# Patient Record
Sex: Male | Born: 1960 | Race: White | Hispanic: No | Marital: Married | Smoking: Former smoker
Health system: Southern US, Community
[De-identification: ages and names within clinical notes are randomized; demographics above are authoritative.]

## PROBLEM LIST (undated history)

## (undated) DIAGNOSIS — S065XAA Traumatic subdural hemorrhage with loss of consciousness status unknown, initial encounter: Secondary | ICD-10-CM

## (undated) DIAGNOSIS — R001 Bradycardia, unspecified: Secondary | ICD-10-CM

## (undated) DIAGNOSIS — S065X9A Traumatic subdural hemorrhage with loss of consciousness of unspecified duration, initial encounter: Secondary | ICD-10-CM

## (undated) DIAGNOSIS — E785 Hyperlipidemia, unspecified: Secondary | ICD-10-CM

---

## 2015-12-04 HISTORY — PX: BRAIN SURGERY: SHX531

## 2016-01-04 HISTORY — PX: PACEMAKER INSERTION: SHX728

## 2016-04-02 HISTORY — PX: CAROTID STENT: SHX1301

## 2016-04-27 ENCOUNTER — Ambulatory Visit (INDEPENDENT_AMBULATORY_CARE_PROVIDER_SITE_OTHER)
Admission: EM | Admit: 2016-04-27 | Discharge: 2016-04-27 | Disposition: A | Discharge status: Other Acute Inpatient Hospital | Payer: Managed Care, Other (non HMO) | Source: Home / Self Care | Attending: Internal Medicine | Admitting: Internal Medicine

## 2016-04-27 ENCOUNTER — Emergency Department: Payer: Managed Care, Other (non HMO)

## 2016-04-27 ENCOUNTER — Observation Stay
Admission: EM | Admit: 2016-04-27 | Discharge: 2016-04-28 | Disposition: A | Discharge status: Home / Self Care | Payer: Managed Care, Other (non HMO) | Attending: Internal Medicine | Admitting: Internal Medicine

## 2016-04-27 DIAGNOSIS — R51 Headache: Secondary | ICD-10-CM | POA: Insufficient documentation

## 2016-04-27 DIAGNOSIS — R001 Bradycardia, unspecified: Secondary | ICD-10-CM | POA: Diagnosis not present

## 2016-04-27 DIAGNOSIS — Z950 Presence of cardiac pacemaker: Secondary | ICD-10-CM | POA: Insufficient documentation

## 2016-04-27 DIAGNOSIS — G4700 Insomnia, unspecified: Secondary | ICD-10-CM | POA: Insufficient documentation

## 2016-04-27 DIAGNOSIS — E785 Hyperlipidemia, unspecified: Secondary | ICD-10-CM | POA: Insufficient documentation

## 2016-04-27 DIAGNOSIS — I081 Rheumatic disorders of both mitral and tricuspid valves: Secondary | ICD-10-CM | POA: Insufficient documentation

## 2016-04-27 DIAGNOSIS — M50322 Other cervical disc degeneration at C5-C6 level: Secondary | ICD-10-CM | POA: Diagnosis not present

## 2016-04-27 DIAGNOSIS — M50321 Other cervical disc degeneration at C4-C5 level: Secondary | ICD-10-CM | POA: Insufficient documentation

## 2016-04-27 DIAGNOSIS — R519 Headache, unspecified: Secondary | ICD-10-CM | POA: Diagnosis present

## 2016-04-27 DIAGNOSIS — R202 Paresthesia of skin: Secondary | ICD-10-CM

## 2016-04-27 DIAGNOSIS — R531 Weakness: Secondary | ICD-10-CM | POA: Diagnosis not present

## 2016-04-27 DIAGNOSIS — M50323 Other cervical disc degeneration at C6-C7 level: Secondary | ICD-10-CM | POA: Insufficient documentation

## 2016-04-27 DIAGNOSIS — Z955 Presence of coronary angioplasty implant and graft: Secondary | ICD-10-CM | POA: Insufficient documentation

## 2016-04-27 DIAGNOSIS — G459 Transient cerebral ischemic attack, unspecified: Secondary | ICD-10-CM | POA: Diagnosis not present

## 2016-04-27 DIAGNOSIS — F17210 Nicotine dependence, cigarettes, uncomplicated: Secondary | ICD-10-CM | POA: Insufficient documentation

## 2016-04-27 HISTORY — DX: Traumatic subdural hemorrhage with loss of consciousness of unspecified duration, initial encounter: S06.5X9A

## 2016-04-27 HISTORY — DX: Bradycardia, unspecified: R00.1

## 2016-04-27 HISTORY — DX: Traumatic subdural hemorrhage with loss of consciousness status unknown, initial encounter: S06.5XAA

## 2016-04-27 HISTORY — DX: Hyperlipidemia, unspecified: E78.5

## 2016-04-27 LAB — CBC
HEMATOCRIT: 37.9 % — AB (ref 40.0–52.0)
HEMOGLOBIN: 13 g/dL (ref 13.0–18.0)
MCH: 32 pg (ref 26.0–34.0)
MCHC: 34.2 g/dL (ref 32.0–36.0)
MCV: 93.5 fL (ref 80.0–100.0)
Platelets: 261 10*3/uL (ref 150–440)
RBC: 4.06 MIL/uL — AB (ref 4.40–5.90)
RDW: 13.3 % (ref 11.5–14.5)
WBC: 9.5 10*3/uL (ref 3.8–10.6)

## 2016-04-27 LAB — DIFFERENTIAL
Basophils Absolute: 0.1 10*3/uL (ref 0–0.1)
Basophils Relative: 1 %
Eosinophils Absolute: 0.2 10*3/uL (ref 0–0.7)
Eosinophils Relative: 2 %
LYMPHS ABS: 3.4 10*3/uL (ref 1.0–3.6)
LYMPHS PCT: 36 %
MONO ABS: 0.8 10*3/uL (ref 0.2–1.0)
Monocytes Relative: 9 %
Neutro Abs: 5 10*3/uL (ref 1.4–6.5)
Neutrophils Relative %: 52 %

## 2016-04-27 LAB — COMPREHENSIVE METABOLIC PANEL
ALK PHOS: 48 U/L (ref 38–126)
ALT: 10 U/L — ABNORMAL LOW (ref 17–63)
ANION GAP: 6 (ref 5–15)
AST: 20 U/L (ref 15–41)
Albumin: 4.4 g/dL (ref 3.5–5.0)
BILIRUBIN TOTAL: 0.3 mg/dL (ref 0.3–1.2)
BUN: 11 mg/dL (ref 6–20)
CALCIUM: 9.3 mg/dL (ref 8.9–10.3)
CO2: 28 mmol/L (ref 22–32)
CREATININE: 1 mg/dL (ref 0.61–1.24)
Chloride: 109 mmol/L (ref 101–111)
GFR calc non Af Amer: >60 mL/min (ref >60)
GLUCOSE: 103 mg/dL — AB (ref 65–99)
Potassium: 3.7 mmol/L (ref 3.5–5.1)
Sodium: 143 mmol/L (ref 135–145)
TOTAL PROTEIN: 7.3 g/dL (ref 6.5–8.1)

## 2016-04-27 LAB — APTT: aPTT: 29 seconds (ref 24–36)

## 2016-04-27 LAB — TROPONIN I: Troponin I: <0.03 ng/mL (ref <0.031)

## 2016-04-27 LAB — PROTIME-INR
INR: 1.11
Prothrombin Time: 14.5 seconds (ref 11.4–15.0)

## 2016-04-27 MED ORDER — ONDANSETRON HCL 4 MG/2ML IJ SOLN
4.0000 mg | Freq: Once | INTRAMUSCULAR | Status: AC
  Administered 2016-04-27: 4 mg via INTRAVENOUS
  Filled 2016-04-27: qty 2

## 2016-04-27 MED ORDER — MORPHINE SULFATE (PF) 4 MG/ML IV SOLN
INTRAVENOUS | Status: AC
  Filled 2016-04-27: qty 1

## 2016-04-27 MED ORDER — MORPHINE SULFATE (PF) 4 MG/ML IV SOLN
4.0000 mg | Freq: Once | INTRAVENOUS | Status: AC
  Administered 2016-04-27: 4 mg via INTRAVENOUS
  Filled 2016-04-27: qty 1

## 2016-04-27 MED ORDER — KETOROLAC TROMETHAMINE 60 MG/2ML IM SOLN
60.0000 mg | Freq: Once | INTRAMUSCULAR | Status: AC
  Administered 2016-04-27: 60 mg via INTRAMUSCULAR

## 2016-04-27 MED ORDER — MORPHINE SULFATE (PF) 4 MG/ML IV SOLN
4.0000 mg | Freq: Once | INTRAVENOUS | Status: AC
  Administered 2016-04-27: 4 mg via INTRAVENOUS
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
## 2016-04-27 NOTE — Consult Note (Signed)
Stroke Alert-patient is calm, following up on referral from urgent care and has experienced much pain from headaches. Given past history he is careful with this type of symptom and is willing to undergo additional procedures in order to be sure he is able to work, relieve his headaches.  Married, but wife is not present; he states he is emotionally okay, no need for assistance at this time.
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
## 2016-04-28 ENCOUNTER — Emergency Department: Payer: Managed Care, Other (non HMO)

## 2016-04-28 ENCOUNTER — Observation Stay
Admit: 2016-04-28 | Discharge: 2016-04-28 | Disposition: A | Discharge status: Home / Self Care | Payer: Managed Care, Other (non HMO) | Attending: Internal Medicine | Admitting: Internal Medicine

## 2016-04-28 DIAGNOSIS — R51 Headache: Secondary | ICD-10-CM

## 2016-04-28 DIAGNOSIS — R519 Headache, unspecified: Secondary | ICD-10-CM | POA: Diagnosis present

## 2016-04-28 DIAGNOSIS — G451 Carotid artery syndrome (hemispheric): Secondary | ICD-10-CM

## 2016-04-28 DIAGNOSIS — G459 Transient cerebral ischemic attack, unspecified: Secondary | ICD-10-CM | POA: Diagnosis present

## 2016-04-28 LAB — LIPID PANEL
CHOL/HDL RATIO: 2.1 ratio
CHOLESTEROL: 112 mg/dL (ref 0–200)
HDL: 54 mg/dL (ref >40)
LDL Cholesterol: 33 mg/dL (ref 0–99)
TRIGLYCERIDES: 124 mg/dL (ref <150)
VLDL: 25 mg/dL (ref 0–40)

## 2016-04-28 LAB — ECHOCARDIOGRAM COMPLETE
HEIGHTINCHES: 74 in
Weight: 2935 oz

## 2016-04-28 LAB — HEMOGLOBIN A1C: HEMOGLOBIN A1C: 5.4 % (ref 4.0–6.0)

## 2016-04-28 MED ORDER — MORPHINE SULFATE (PF) 4 MG/ML IV SOLN
4.0000 mg | Freq: Once | INTRAVENOUS | Status: AC
Start: 2016-04-28 — End: 2016-04-28
  Administered 2016-04-28: 4 mg via INTRAVENOUS

## 2016-04-28 MED ORDER — OXYCODONE-ACETAMINOPHEN 5-325 MG PO TABS
ORAL_TABLET | ORAL | Status: AC
  Filled 2016-04-28: qty 1

## 2016-04-28 MED ORDER — MORPHINE SULFATE (PF) 2 MG/ML IV SOLN
2.0000 mg | INTRAVENOUS | Status: DC | PRN
  Administered 2016-04-28 (×2): 2 mg via INTRAVENOUS
  Filled 2016-04-28 (×2): qty 1

## 2016-04-28 MED ORDER — STROKE: EARLY STAGES OF RECOVERY BOOK: Freq: Once | Status: DC

## 2016-04-28 MED ORDER — ASPIRIN 300 MG RE SUPP
300.0000 mg | Freq: Every day | RECTAL | Status: DC
  Filled 2016-04-28: qty 1

## 2016-04-28 MED ORDER — MORPHINE SULFATE (PF) 4 MG/ML IV SOLN
INTRAVENOUS | Status: AC
  Filled 2016-04-28: qty 1

## 2016-04-28 MED ORDER — ENOXAPARIN SODIUM 40 MG/0.4ML ~~LOC~~ SOLN: 40.0000 mg | SUBCUTANEOUS | Status: DC

## 2016-04-28 MED ORDER — IOPAMIDOL (ISOVUE-370) INJECTION 76%
100.0000 mL | Freq: Once | INTRAVENOUS | Status: AC | PRN
  Administered 2016-04-28: 100 mL via INTRAVENOUS

## 2016-04-28 MED ORDER — OXYCODONE-ACETAMINOPHEN 7.5-325 MG PO TABS
1.0000 | ORAL_TABLET | Freq: Four times a day (QID) | ORAL | Status: DC | PRN
  Administered 2016-04-28 (×2): 1 via ORAL
  Filled 2016-04-28 (×2): qty 1

## 2016-04-28 MED ORDER — ROSUVASTATIN CALCIUM 20 MG PO TABS
20.0000 mg | ORAL_TABLET | Freq: Every day | ORAL | Status: DC
  Administered 2016-04-28: 09:00:00 20 mg via ORAL
  Filled 2016-04-28: qty 1

## 2016-04-28 MED ORDER — CLOPIDOGREL BISULFATE 75 MG PO TABS
75.0000 mg | ORAL_TABLET | Freq: Every day | ORAL | Status: DC
  Administered 2016-04-28: 09:00:00 75 mg via ORAL
  Filled 2016-04-28: qty 1

## 2016-04-28 MED ORDER — ASPIRIN 325 MG PO TABS
325.0000 mg | ORAL_TABLET | Freq: Every day | ORAL | Status: DC
  Administered 2016-04-28: 325 mg via ORAL

## 2016-04-28 MED ORDER — ADULT MULTIVITAMIN W/MINERALS CH
1.0000 | ORAL_TABLET | Freq: Every day | ORAL | Status: DC
  Administered 2016-04-28: 1 via ORAL
  Filled 2016-04-28: qty 1

## 2016-04-28 MED ORDER — ASPIRIN 325 MG PO TABS
325.0000 mg | ORAL_TABLET | Freq: Every day | ORAL | Status: DC
  Filled 2016-04-28: qty 1

## 2016-04-28 NOTE — H&P (Signed)
[facility] - [place] [facility]   PATIENT NAME: Tanner Rangel    MR#:  [id number]  DATE OF BIRTH:  1961-08-23  DATE OF ADMISSION:  04/27/2016  PRIMARY CARE PHYSICIAN: No primary care provider on file.   REQUESTING/REFERRING PHYSICIAN:   CHIEF COMPLAINT:   Chief Complaint  Patient presents with  . Headache    HISTORY OF PRESENT ILLNESS: Tanner Rangel  is a 55 y.o. male with a known history of Hyperlipidemia, subdural hematoma in the past presented to the emergency room with left arm numbness and weakness started yesterday. Patient also had a headache since yesterday which was pounding in nature. No history of any nausea or vomiting. No history of any slurred speech or dysarthria. Telemetry neurology consult was done in the emergency room was recommended observation admission and further workup. No history of any chest pain and shortness of breath. No history of any fever or chills or cough. Patient has a pacemaker.  PAST MEDICAL HISTORY:   Past Medical History  Diagnosis Date  . Bradycardia   . Hyperlipidemia   . Subdural hematoma (HCC)     PAST SURGICAL HISTORY: Past Surgical History  Procedure Laterality Date  . Carotid stent Left 04/2016  . Pacemaker insertion  01/2016  . Brain surgery  2017    SOCIAL HISTORY:  Social History  Substance Use Topics  . Smoking status: Current Every Day Smoker -- 0.10 packs/day for 45 years    Types: Cigarettes  . Smokeless tobacco: Not on file  . Alcohol Use: 0.0 oz/week    0 Standard drinks or equivalent per week     Comment: occasionally    FAMILY HISTORY:  Family History  Problem Relation Age of Onset  . Heart disease Father     DRUG ALLERGIES: No Known Allergies  REVIEW OF SYSTEMS:   CONSTITUTIONAL: No fever, fatigue or weakness.  EYES: No blurred or double vision.  EARS, NOSE, AND THROAT: No tinnitus or ear pain.  RESPIRATORY: No cough, shortness of breath, wheezing or hemoptysis.   CARDIOVASCULAR: No chest pain, orthopnea, edema.  GASTROINTESTINAL: No nausea, vomiting, diarrhea or abdominal pain.  GENITOURINARY: No dysuria, hematuria.  ENDOCRINE: No polyuria, nocturia,  HEMATOLOGY: No anemia, easy bruising or bleeding SKIN: No rash or lesion. MUSCULOSKELETAL: No joint pain or arthritis.left arm numbness   NEUROLOGIC: tingling, numbness, weakness left arm. PSYCHIATRY: No anxiety or depression.   MEDICATIONS AT HOME:  Prior to Admission medications   Medication Sig Start Date End Date Taking? Authorizing Provider  aspirin 325 MG tablet Take 325 mg by mouth daily.   Yes Historical Provider, MD  clopidogrel (PLAVIX) 75 MG tablet Take 75 mg by mouth daily.   Yes Historical Provider, MD  Multiple Vitamin (MULTIVITAMIN) tablet Take 1 tablet by mouth daily.   Yes Historical Provider, MD  rosuvastatin (CRESTOR) 20 MG tablet Take 20 mg by mouth daily.   Yes Historical Provider, MD      PHYSICAL EXAMINATION:   VITAL SIGNS: Blood pressure 113/63, pulse 54, temperature 98.4 F (36.9 C), temperature source Oral, resp. rate 14, height 6\' 2"  (1.88 m), weight 83.915 kg (185 lb), SpO2 98 %.  GENERAL:  55 y.o.-year-old patient lying in the bed with no acute distress.  EYES: Pupils equal, round, reactive to light and accommodation. No scleral icterus. Extraocular muscles intact.  HEENT: Head atraumatic, normocephalic. Oropharynx and nasopharynx clear.  NECK:  Supple, no jugular venous distention. No thyroid enlargement, no tenderness.  LUNGS: Normal breath sounds  bilaterally, no wheezing, rales,rhonchi or crepitation. No use of accessory muscles of respiration.  CARDIOVASCULAR: S1, S2 normal. No murmurs, rubs, or gallops.  ABDOMEN: Soft, nontender, nondistended. Bowel sounds present. No organomegaly or mass.  EXTREMITIES: No pedal edema, cyanosis, or clubbing.  NEUROLOGIC: Cranial nerves II through XII are intact. Muscle strength 5/5 in all extremities. Sensation intact. Gait  not checked. No cerebellar signs noted. PSYCHIATRIC: The patient is alert and oriented x 3.  SKIN: No obvious rash, lesion, or ulcer.   LABORATORY PANEL:   CBC  Recent Labs Lab 04/27/16 2058  WBC 9.5  HGB 13.0  HCT 37.9*  PLT 261  MCV 93.5  MCH 32.0  MCHC 34.2  RDW 13.3  LYMPHSABS 3.4  MONOABS 0.8  EOSABS 0.2  BASOSABS 0.1   ------------------------------------------------------------------------------------------------------------------  Chemistries   Recent Labs Lab 04/27/16 2058  NA 143  K 3.7  CL 109  CO2 28  GLUCOSE 103*  BUN 11  CREATININE 1.00  CALCIUM 9.3  AST 20  ALT 10*  ALKPHOS 48  BILITOT 0.3   ------------------------------------------------------------------------------------------------------------------ estimated creatinine clearance is 94.8 mL/min (by C-G formula based on Cr of 1). ------------------------------------------------------------------------------------------------------------------ No results for input(s): TSH, T4TOTAL, T3FREE, THYROIDAB in the last 72 hours.  Invalid input(s): FREET3   Coagulation profile  Recent Labs Lab 04/27/16 2058  INR 1.11   ------------------------------------------------------------------------------------------------------------------- No results for input(s): DDIMER in the last 72 hours. -------------------------------------------------------------------------------------------------------------------  Cardiac Enzymes  Recent Labs Lab 04/27/16 2058  TROPONINI <0.03   ------------------------------------------------------------------------------------------------------------------ Invalid input(s): POCBNP  ---------------------------------------------------------------------------------------------------------------  Urinalysis No results found for: COLORURINE, APPEARANCEUR, LABSPEC, PHURINE, GLUCOSEU, HGBUR, BILIRUBINUR, KETONESUR, PROTEINUR, UROBILINOGEN, NITRITE,  LEUKOCYTESUR   RADIOLOGY: Ct Angio Head W/cm &/or Wo Cm  04/28/2016  CLINICAL DATA:  Initial evaluation for acute headache with left arm numbness, photosensitivity. EXAM: CT ANGIOGRAPHY HEAD AND NECK TECHNIQUE: Multidetector CT imaging of the head and neck was performed using the standard protocol during bolus administration of intravenous contrast. Multiplanar CT image reconstructions and MIPs were obtained to evaluate the vascular anatomy. Carotid stenosis measurements (when applicable) are obtained utilizing NASCET criteria, using the distal internal carotid diameter as the denominator. CONTRAST:  100 cc of Isovue 370. COMPARISON:  Prior CT from 04/27/2016. FINDINGS: CTA NECK Aortic arch: Visualized aortic arch of normal caliber with normal branch pattern. Scattered atheromatous plaque within the arch itself and about the origin of the great vessels without high-grade stenosis. Visualized subclavian arteries are widely patent. Right carotid system: Right common carotid artery patent from its origin to the bifurcation. Scattered calcified atheromatous plaque about the right bifurcation/proximal right ICA. Associated short-segment stenosis of approximately 60% present by NASCET criteria. Right ICA well opacified distally to the skullbase without stenosis, dissection, or occlusion. Left carotid system: Left common carotid artery patent from its origin to the bifurcation. Scattered calcified plaque about the left bifurcation with moderate narrowing of approximately 50%. Stent in place within the proximal left ICA extending from the bifurcation there is patent flow through the stent without significant intraluminal stenosis. Distally, flow within the left ICA patent to the skullbase without stenosis, dissection, or occlusion. Vertebral arteries:Both vertebral arteries arise from the subclavian arteries. Focal plaque at the origin of the right vertebral artery with mild to moderate narrowing. Right vertebral  artery is dominant. Vertebral arteries otherwise patent within the neck without stenosis, dissection, or occlusion. Skeleton: No acute osseous abnormality. No worrisome lytic or blastic osseous lesions. Mild to moderate degenerative spondylolysis at C5-6. Other neck: Visualized lungs are clear. Visualized  superior mediastinum within normal limits. Right-sided pacemaker partially visualized. Thyroid gland normal. No adenopathy within the neck. No acute soft tissue abnormality. CTA HEAD Anterior circulation: Petrous segments widely patent bilaterally. Cavernous and supraclinoid segments patent without flow-limiting stenosis. Minimal atheromatous plaque within the cavernous ICAs. A1 segments, anterior communicating artery, and anterior cerebral arteries well opacified. M1 segments patent without stenosis or occlusion. MCA bifurcations within normal limits. No proximal M2 branch occlusion. Distal MCA branches well opacified and symmetric bilaterally. Posterior circulation: Vertebral arteries patent to the vertebrobasilar junction. Focal plaque within the left V4 segment with moderate stenosis. Minimal plaque within the right V4 segment without high-grade stenosis. Posterior inferior cerebral arteries patent. Basilar artery well opacified to its distal aspect. Superior cerebral arteries patent bilaterally. Both of the posterior cerebral arteries arise from the basilar artery and are well opacified to their distal aspects. Venous sinuses: Patent without evidence for venous sinus thrombosis Anatomic variants: No anatomic variant. No aneurysm or vascular malformation. Delayed phase: No abnormal enhancement on delayed sequence. IMPRESSION: CTA NECK IMPRESSION: 1. 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 2. More mild atheromatous plaque about the left carotid bifurcation with associated narrowing of approximately 50% by NASCET criteria. 3.  Stent in place within the proximal left ICA. Patent flow through the stent without significant intraluminal narrowing. 4. Focal plaque at the origin of the right vertebral artery with secondary mild to moderate narrowing. Vertebral arteries otherwise patent within the neck. CTA HEAD IMPRESSION: 1. No large or proximal arterial branch occlusion within the intracranial circulation. No high-grade or correctable stenosis. 2. Focal plaque within the left V4 segment with moderate focal stenosis. 3. Mild atheromatous plaque within the cavernous ICAs without flow-limiting stenosis. Electronically Signed   By: Rise Mu M.D.   On: 04/28/2016 02:53   Ct Head Wo Contrast  04/27/2016  CLINICAL DATA:  Code stroke. Acute onset of headache and insomnia. Initial encounter. EXAM: CT HEAD WITHOUT CONTRAST TECHNIQUE: Contiguous axial images were obtained from the base of the skull through the vertex without intravenous contrast. COMPARISON:  None. FINDINGS: There is no evidence of acute infarction, mass lesion, or intra- or extra-axial hemorrhage on CT. The posterior fossa, including the cerebellum, brainstem and fourth ventricle, is within normal limits. The third and lateral ventricles, and basal ganglia are unremarkable in appearance. The cerebral hemispheres are symmetric in appearance, with normal gray-white differentiation. No mass effect or midline shift is seen. There is no evidence of acute fracture; bore holes are noted at the right frontal and parietal calvarium. An apparent chronic fracture is noted at the left zygomatic arch. The orbits are within normal limits. The paranasal sinuses and mastoid air cells are well-aerated. No significant soft tissue abnormalities are seen. IMPRESSION: Unremarkable noncontrast CT of the head. These results were called by telephone at the time of interpretation on 04/27/2016 at 9:42 pm to Dr. Toney Rakes, who verbally acknowledged these results. Electronically Signed   By:  Roanna Raider M.D.   On: 04/27/2016 21:42   Ct Angio Neck W/cm &/or Wo/cm  04/28/2016  CLINICAL DATA:  Initial evaluation for acute headache with left arm numbness, photosensitivity. EXAM: CT ANGIOGRAPHY HEAD AND NECK TECHNIQUE: Multidetector CT imaging of the head and neck was performed using the standard protocol during bolus administration of intravenous contrast. Multiplanar CT image reconstructions and MIPs were obtained to evaluate the vascular anatomy. Carotid stenosis measurements (when applicable) are obtained utilizing NASCET criteria, using  the distal internal carotid diameter as the denominator. CONTRAST:  100 cc of Isovue 370. COMPARISON:  Prior CT from 04/27/2016. FINDINGS: CTA NECK Aortic arch: Visualized aortic arch of normal caliber with normal branch pattern. Scattered atheromatous plaque within the arch itself and about the origin of the great vessels without high-grade stenosis. Visualized subclavian arteries are widely patent. Right carotid system: Right common carotid artery patent from its origin to the bifurcation. Scattered calcified atheromatous plaque about the right bifurcation/proximal right ICA. Associated short-segment stenosis of approximately 60% present by NASCET criteria. Right ICA well opacified distally to the skullbase without stenosis, dissection, or occlusion. Left carotid system: Left common carotid artery patent from its origin to the bifurcation. Scattered calcified plaque about the left bifurcation with moderate narrowing of approximately 50%. Stent in place within the proximal left ICA extending from the bifurcation there is patent flow through the stent without significant intraluminal stenosis. Distally, flow within the left ICA patent to the skullbase without stenosis, dissection, or occlusion. Vertebral arteries:Both vertebral arteries arise from the subclavian arteries. Focal plaque at the origin of the right vertebral artery with mild to moderate narrowing.  Right vertebral artery is dominant. Vertebral arteries otherwise patent within the neck without stenosis, dissection, or occlusion. Skeleton: No acute osseous abnormality. No worrisome lytic or blastic osseous lesions. Mild to moderate degenerative spondylolysis at C5-6. Other neck: Visualized lungs are clear. Visualized superior mediastinum within normal limits. Right-sided pacemaker partially visualized. Thyroid gland normal. No adenopathy within the neck. No acute soft tissue abnormality. CTA HEAD Anterior circulation: Petrous segments widely patent bilaterally. Cavernous and supraclinoid segments patent without flow-limiting stenosis. Minimal atheromatous plaque within the cavernous ICAs. A1 segments, anterior communicating artery, and anterior cerebral arteries well opacified. M1 segments patent without stenosis or occlusion. MCA bifurcations within normal limits. No proximal M2 branch occlusion. Distal MCA branches well opacified and symmetric bilaterally. Posterior circulation: Vertebral arteries patent to the vertebrobasilar junction. Focal plaque within the left V4 segment with moderate stenosis. Minimal plaque within the right V4 segment without high-grade stenosis. Posterior inferior cerebral arteries patent. Basilar artery well opacified to its distal aspect. Superior cerebral arteries patent bilaterally. Both of the posterior cerebral arteries arise from the basilar artery and are well opacified to their distal aspects. Venous sinuses: Patent without evidence for venous sinus thrombosis Anatomic variants: No anatomic variant. No aneurysm or vascular malformation. Delayed phase: No abnormal enhancement on delayed sequence. IMPRESSION: CTA NECK IMPRESSION: 1. 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 2. More mild atheromatous plaque about the left carotid bifurcation with associated narrowing of approximately 50% by  NASCET criteria. 3. Stent in place within the proximal left ICA. Patent flow through the stent without significant intraluminal narrowing. 4. Focal plaque at the origin of the right vertebral artery with secondary mild to moderate narrowing. Vertebral arteries otherwise patent within the neck. CTA HEAD IMPRESSION: 1. No large or proximal arterial branch occlusion within the intracranial circulation. No high-grade or correctable stenosis. 2. Focal plaque within the left V4 segment with moderate focal stenosis. 3. Mild atheromatous plaque within the cavernous ICAs without flow-limiting stenosis. Electronically Signed   By: Rise Mu M.D.   On: 04/28/2016 02:53    EKG: Orders placed or performed during the hospital encounter of 04/27/16  . EKG 12-Lead  . EKG 12-Lead  . ED EKG  . ED EKG    IMPRESSION AND PLAN: 55 year old male patient with history of hyperlipidemia,  bradycardia presented to the emergency room for numbness, tingling sensation left arm. Admitting diagnosis 1. Left arm paresthesia rule out stroke 2. Hyperlipidemia 3. History of pacemaker 4. Headache Treatment plan Admit patient to medical floor Check carotid ultrasound CT head no acute intracranial abnormality Control headache DVT prophylaxis subcutaneous Lovenox 40 MG daily Resume aspirin and Plavix orally Neurology consultation.  All the records are reviewed and case discussed with ED provider. Management plans discussed with the patient, family and they are in agreement.  CODE STATUS:FULL Code Status History    This patient does not have a recorded code status. Please follow your organizational policy for patients in this situation.    Advance Directive Documentation        Most Recent Value   Type of Advance Directive  Healthcare Power of Attorney   Pre-existing out of facility DNR order (yellow form or pink MOST form)     "MOST" Form in Place?         TOTAL TIME TAKING CARE OF THIS PATIENT: 50  minutes.    Ihor AustinPavan Muslima Toppins M.D on 04/28/2016 at 4:52 AM  Between 7am to 6pm - Pager - [phone]  After 6pm go to [email or web address] - password [email or web address]  [facility]  Office  [phone]  CC: Primary care physician; No primary care provider on file.

## 2016-04-28 NOTE — Evaluation (Signed)
Occupational Therapy Evaluation Patient Details Name: Oren SectionJoseph Habel MRN: [id number] DOB: 25-Sep-1961 Today's Date: 04/28/2016    History of Present Illness 55 y/o male here after a few days of L sided weakness and headache. The L sided symptoms have resolved, he has a history of multiple brain bleeds, CT (-) for acute issues.    Clinical Impression   55yo male presents with headache, resolved L sided symptoms. Pt at baseline, indep for bed mobility, functional mobility, self care. No additional OT needs at this time.     Follow Up Recommendations  No OT follow up    Equipment Recommendations  None recommended by OT    Recommendations for Other Services       Precautions / Restrictions Precautions Precautions: None Restrictions Weight Bearing Restrictions: No      Mobility Bed Mobility Overal bed mobility: Independent                Transfers Overall transfer level: Independent                    Balance Overall balance assessment: Independent                                          ADL Overall ADL's : At baseline                                             Vision Vision Assessment?: No apparent visual deficits   Perception     Praxis      Pertinent Vitals/Pain Pain Assessment: 0-10 Pain Score: 7  Pain Location: headache Pain Intervention(s): Limited activity within patient's tolerance;Monitored during session;Patient requesting pain meds-RN notified     Hand Dominance Left   Extremity/Trunk Assessment Upper Extremity Assessment Upper Extremity Assessment: Overall WFL for tasks assessed   Lower Extremity Assessment Lower Extremity Assessment: Overall WFL for tasks assessed   Cervical / Trunk Assessment Cervical / Trunk Assessment: Normal   Communication Communication Communication: No difficulties   Cognition Arousal/Alertness: Awake/alert Behavior During Therapy: WFL for tasks  assessed/performed Overall Cognitive Status: Within Functional Limits for tasks assessed                     General Comments       Exercises       Shoulder Instructions      Home Living Family/patient expects to be discharged to:: Private residence Living Arrangements: Spouse/significant other;Children Available Help at Discharge: Family;Available 24 hours/day Type of Home: House Home Access: Level entry     Home Layout: Two level     Bathroom Shower/Tub: Tub/shower unit;Walk-in shower Shower/tub characteristics: Door Bathroom Toilet: Standard                Prior Functioning/Environment Level of Independence: Independent        Comments: 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  He is typically completely independent for all required acts, ADLs, etc.    OT Diagnosis:     OT Problem List:     OT Treatment/Interventions:      OT Goals(Current goals can be found in the care plan section) Acute Rehab OT Goals Patient Stated Goal: get some rest and then get out of here OT Goal Formulation:  With patient Potential to Achieve Goals: Good  OT Frequency:     Barriers to D/C:            Co-evaluation              End of Session Nurse Communication: Patient requests pain meds  Activity Tolerance: Patient tolerated treatment well Patient left: in bed;with call bell/phone within reach   Time: 1005-1025 OT Time Calculation (min): 20 min Charges:  OT General Charges $OT Visit: 1 Procedure OT Evaluation $OT Eval Low Complexity: 1 Procedure G-Codes: OT G-codes **NOT FOR INPATIENT CLASS** Functional Limitation: Self care Self Care Current Status (W9604): 0 percent impaired, limited or restricted Self Care Goal Status (V4098): 0 percent impaired, limited or restricted  Eliezer Bottom, OTR/L 04/28/2016, 11:52 AM

## 2016-04-28 NOTE — Evaluation (Signed)
Physical Therapy Evaluation Patient Details Name: Tanner Rangel MRN: [id number] DOB: 02-02-1961 Today's Date: 04/28/2016   History of Present Illness  55 y/o male here after a few days of L sided weakness and headache. The L sided symptoms have resolved, he has a history of multiple brain bleeds, CT (-) for acute issues.   Clinical Impression  Pt did well with PT exam and showed no asymmetrical weakness or symptoms.  He reports he was able to use phone, fork, etc w/o issue and other than his headache and being tired feels like his normal self.  Pt showed no safety issues with mobility, ambulation, steps, etc and should be safe to return to his routine w/o continued PT services.     Follow Up Recommendations No PT follow up    Equipment Recommendations       Recommendations for Other Services       Precautions / Restrictions Precautions Precautions: None Restrictions Weight Bearing Restrictions: No      Mobility  Bed Mobility Overal bed mobility: Independent                Transfers Overall transfer level: Independent                  Ambulation/Gait Ambulation/Gait assistance: Independent Ambulation Distance (Feet): 250 Feet Assistive device: None       General Gait Details: Pt walks with good speed and confidence and has no safety issues.  He reports that he is essentially at his baseline.  Stairs Stairs: Yes Stairs assistance: Independent Stair Management: No rails;One rail Right (no rails going up) Number of Stairs: 15 General stair comments: Pt easily able to negotiate up/down with reciprocal pattern and no hesitancy  Wheelchair Mobility    Modified Rankin (Stroke Patients Only)       Balance Overall balance assessment: Independent                                           Pertinent Vitals/Pain Pain Assessment:  (headache, not rated)    Home Living Family/patient expects to be discharged to:: Private  residence Living Arrangements: Spouse/significant other                    Prior Function Level of Independence: Independent         Comments: Pt works as a Theme park manager all the time.  He is typically completely independent for all required acts, ADLs, etc.     Hand Dominance   Dominant Hand: Left    Extremity/Trunk Assessment   Upper Extremity Assessment: Overall WFL for tasks assessed           Lower Extremity Assessment: Overall WFL for tasks assessed         Communication   Communication: No difficulties  Cognition Arousal/Alertness: Awake/alert Behavior During Therapy: WFL for tasks assessed/performed Overall Cognitive Status: Within Functional Limits for tasks assessed                      General Comments      Exercises        Assessment/Plan    PT Assessment Patent does not need any further PT services  PT Diagnosis Generalized weakness   PT Problem List    PT Treatment Interventions     PT Goals (Current goals can be found in the Care Plan  section) Acute Rehab PT Goals Patient Stated Goal: get some rest and then get out of here    Frequency     Barriers to discharge        Co-evaluation               End of Session   Activity Tolerance: Patient tolerated treatment well Patient left: in bed;with call bell/phone within reach      Functional Assessment Tool Used: clinical judgement Functional Limitation: Mobility: Walking and moving around Mobility: Walking and Moving Around Current Status [phone](G8978): 0 percent impaired, limited or restricted Mobility: Walking and Moving Around Goal Status [phone](G8979): 0 percent impaired, limited or restricted Mobility: Walking and Moving Around Discharge Status [phone](G8980): 0 percent impaired, limited or restricted    Time: [id number] PT Time Calculation (min) (ACUTE ONLY): 10 min   Charges:   PT Evaluation $PT Eval Low Complexity: 1 Procedure     PT G  Codes:   PT G-Codes **NOT FOR INPATIENT CLASS** Functional Assessment Tool Used: clinical judgement Functional Limitation: Mobility: Walking and moving around Mobility: Walking and Moving Around Current Status ([id number](G8978): 0 percent impaired, limited or restricted Mobility: Walking and Moving Around Goal Status ([id number](G8979): 0 percent impaired, limited or restricted Mobility: Walking and Moving Around Discharge Status ([id number](G8980): 0 percent impaired, limited or restricted    Malachi ProGalen R Jennae Hakeem, DPT 04/28/2016, 11:08 AM
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
## 2016-04-28 NOTE — Progress Notes (Signed)
*  PRELIMINARY RESULTS* Echocardiogram 2D Echocardiogram has been performed.  Garrel Ridgelikeshia S Stills 04/28/2016, 9:21 AM

## 2016-04-28 NOTE — Discharge Summary (Signed)
Tanner Rangel, is a 55 y.o. male  DOB [birth year]  MRN [id number].  Admission date:  04/27/2016  Admitting Physician  Ihor Austin, MD  Discharge Date:  04/28/2016   Primary MD  No primary care provider on file.  Recommendations for primary care physician for things to follow:   Follow up  with primary doctor in [place]. 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 Bradycardia   . Hyperlipidemia   . Subdural hematoma [facility])     Past Surgical History  Procedure Laterality Date  . Carotid stent Left 04/2016  . Pacemaker insertion  01/2016  . Brain surgery  2017       History of present illness and  Hospital Course:     Kindly see H&P for history of present illness and admission details, please review complete Labs, Consult reports and Test reports for all details in brief  HPI  from the history and physical done on the day of admission 55 year old male patient with hyperlipidemia, history of subdural hematoma 6 years ago status post drainage, recent history of pacemaker placement admitted for headache, left arm paresthesias.   Hospital Course  #1 headache and transient left arm paresthesias evaluate for stroke: Head CT initially unremarkable. Patient had CT angiogram of the head . CT angiogram head did not show any intracranial circulation problems. Patient has a plaque in V4 segment with moderate stenosis on the left side. Patient had a left carotid artery stent  placement. Right carotid bifurcation has short segment stenosis up to 60%. He patient is already on aspirin, Plavix, statins. Advised him to continue that. Waiting for neurology evaluation, echocardiogram results, if agreeable with neurology, if echo is within normal range with normal EF patient can be discharged home.  Discharge Condition: stable   Follow UP      Discharge Instructions  and  Discharge Medications   *     Medication List    ASK your doctor about these medications        aspirin 325 MG tablet  Take 325 mg by mouth daily.     clopidogrel 75 MG tablet  Commonly known as:  PLAVIX  Take 75 mg by mouth daily.     multivitamin tablet  Take 1 tablet by mouth daily.     rosuvastatin 20 MG tablet  Commonly known as:  CRESTOR  Take 20 mg by mouth daily.          Diet and Activity recommendation: See Discharge Instructions above   Consults obtained - neuro   Major procedures and Radiology Reports - PLEASE review detailed and final reports for all details, in brief -      Ct Angio Head W/cm &/or Wo Cm  04/28/2016  CLINICAL DATA:  Initial evaluation for acute headache with left arm numbness, photosensitivity. EXAM: CT ANGIOGRAPHY HEAD AND NECK TECHNIQUE: Multidetector CT imaging of the head and neck was performed using the standard protocol during bolus administration of intravenous contrast. Multiplanar CT image reconstructions and MIPs were obtained to evaluate the vascular anatomy. Carotid stenosis measurements (when applicable) are obtained utilizing NASCET criteria, using the distal internal carotid diameter as the denominator.  CONTRAST:  100 cc of Isovue 370. COMPARISON:  Prior CT from 04/27/2016. FINDINGS: CTA NECK Aortic arch: Visualized aortic arch of normal caliber with normal branch pattern. Scattered atheromatous plaque within the arch itself and about the origin of the great vessels without high-grade stenosis. Visualized subclavian arteries are  widely patent. Right carotid system: Right common carotid artery patent from its origin to the bifurcation. Scattered calcified atheromatous plaque about the right bifurcation/proximal right ICA. Associated short-segment stenosis of approximately 60% present by NASCET criteria. Right ICA well opacified distally to the skullbase without stenosis, dissection, or occlusion. Left carotid system: Left common carotid artery patent from its origin to the bifurcation. Scattered calcified plaque about the left bifurcation with moderate narrowing of approximately 50%. Stent in place within the proximal left ICA extending from the bifurcation there is patent flow through the stent without significant intraluminal stenosis. Distally, flow within the left ICA patent to the skullbase without stenosis, dissection, or occlusion. Vertebral arteries:Both vertebral arteries arise from the subclavian arteries. Focal plaque at the origin of the right vertebral artery with mild to moderate narrowing. Right vertebral artery is dominant. Vertebral arteries otherwise patent within the neck without stenosis, dissection, or occlusion. Skeleton: No acute osseous abnormality. No worrisome lytic or blastic osseous lesions. Mild to moderate degenerative spondylolysis at C5-6. Other neck: Visualized lungs are clear. Visualized superior mediastinum within normal limits. Right-sided pacemaker partially visualized. Thyroid gland normal. No adenopathy within the neck. No acute soft tissue abnormality. CTA HEAD Anterior circulation: Petrous segments widely patent bilaterally. Cavernous and supraclinoid segments patent without flow-limiting stenosis. Minimal atheromatous plaque within the cavernous ICAs. A1 segments, anterior communicating artery, and anterior cerebral arteries well opacified. M1 segments patent without stenosis or occlusion. MCA bifurcations within normal limits. No proximal M2 branch occlusion. Distal MCA branches well opacified and  symmetric bilaterally. Posterior circulation: Vertebral arteries patent to the vertebrobasilar junction. Focal plaque within the left V4 segment with moderate stenosis. Minimal plaque within the right V4 segment without high-grade stenosis. Posterior inferior cerebral arteries patent. Basilar artery well opacified to its distal aspect. Superior cerebral arteries patent bilaterally. Both of the posterior cerebral arteries arise from the basilar artery and are well opacified to their distal aspects. Venous sinuses: Patent without evidence for venous sinus thrombosis Anatomic variants: No anatomic variant. No aneurysm or vascular malformation. Delayed phase: No abnormal enhancement on delayed sequence. IMPRESSION: CTA NECK IMPRESSION: 1. 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 2. More mild atheromatous plaque about the left carotid bifurcation with associated narrowing of approximately 50% by NASCET criteria. 3. Stent in place within the proximal left ICA. Patent flow through the stent without significant intraluminal narrowing. 4. Focal plaque at the origin of the right vertebral artery with secondary mild to moderate narrowing. Vertebral arteries otherwise patent within the neck. CTA HEAD IMPRESSION: 1. No large or proximal arterial branch occlusion within the intracranial circulation. No high-grade or correctable stenosis. 2. Focal plaque within the left V4 segment with moderate focal stenosis. 3. Mild atheromatous plaque within the cavernous ICAs without flow-limiting stenosis. Electronically Signed   By: Rise Mu M.D.   On: 04/28/2016 02:53   Ct Head Wo Contrast  04/27/2016  CLINICAL DATA:  Code stroke. Acute onset of headache and insomnia. Initial encounter. EXAM: CT HEAD WITHOUT CONTRAST TECHNIQUE: Contiguous axial images were obtained from the base of the skull through the vertex without intravenous contrast.  COMPARISON:  None. FINDINGS: There  is no evidence of acute infarction, mass lesion, or intra- or extra-axial hemorrhage on CT. The posterior fossa, including the cerebellum, brainstem and fourth ventricle, is within normal limits. The third and lateral ventricles, and basal ganglia are unremarkable in appearance. The cerebral hemispheres are symmetric in appearance, with normal gray-white differentiation. No mass effect or midline shift is seen. There is no evidence of acute fracture; bore holes are noted at the right frontal and parietal calvarium. An apparent chronic fracture is noted at the left zygomatic arch. The orbits are within normal limits. The paranasal sinuses and mastoid air cells are well-aerated. No significant soft tissue abnormalities are seen. IMPRESSION: Unremarkable noncontrast CT of the head. These results were called by telephone at the time of interpretation on 04/27/2016 at 9:42 pm to Dr. Toney Rakes, who verbally acknowledged these results. Electronically Signed   By: Roanna Raider M.D.   On: 04/27/2016 21:42   Ct Angio Neck W/cm &/or Wo/cm  04/28/2016  CLINICAL DATA:  Initial evaluation for acute headache with left arm numbness, photosensitivity. EXAM: CT ANGIOGRAPHY HEAD AND NECK TECHNIQUE: Multidetector CT imaging of the head and neck was performed using the standard protocol during bolus administration of intravenous contrast. Multiplanar CT image reconstructions and MIPs were obtained to evaluate the vascular anatomy. Carotid stenosis measurements (when applicable) are obtained utilizing NASCET criteria, using the distal internal carotid diameter as the denominator. CONTRAST:  100 cc of Isovue 370. COMPARISON:  Prior CT from 04/27/2016. FINDINGS: CTA NECK Aortic arch: Visualized aortic arch of normal caliber with normal branch pattern. Scattered atheromatous plaque within the arch itself and about the origin of the great vessels without high-grade stenosis. Visualized subclavian  arteries are widely patent. Right carotid system: Right common carotid artery patent from its origin to the bifurcation. Scattered calcified atheromatous plaque about the right bifurcation/proximal right ICA. Associated short-segment stenosis of approximately 60% present by NASCET criteria. Right ICA well opacified distally to the skullbase without stenosis, dissection, or occlusion. Left carotid system: Left common carotid artery patent from its origin to the bifurcation. Scattered calcified plaque about the left bifurcation with moderate narrowing of approximately 50%. Stent in place within the proximal left ICA extending from the bifurcation there is patent flow through the stent without significant intraluminal stenosis. Distally, flow within the left ICA patent to the skullbase without stenosis, dissection, or occlusion. Vertebral arteries:Both vertebral arteries arise from the subclavian arteries. Focal plaque at the origin of the right vertebral artery with mild to moderate narrowing. Right vertebral artery is dominant. Vertebral arteries otherwise patent within the neck without stenosis, dissection, or occlusion. Skeleton: No acute osseous abnormality. No worrisome lytic or blastic osseous lesions. Mild to moderate degenerative spondylolysis at C5-6. Other neck: Visualized lungs are clear. Visualized superior mediastinum within normal limits. Right-sided pacemaker partially visualized. Thyroid gland normal. No adenopathy within the neck. No acute soft tissue abnormality. CTA HEAD Anterior circulation: Petrous segments widely patent bilaterally. Cavernous and supraclinoid segments patent without flow-limiting stenosis. Minimal atheromatous plaque within the cavernous ICAs. A1 segments, anterior communicating artery, and anterior cerebral arteries well opacified. M1 segments patent without stenosis or occlusion. MCA bifurcations within normal limits. No proximal M2 branch occlusion. Distal MCA branches well  opacified and symmetric bilaterally. Posterior circulation: Vertebral arteries patent to the vertebrobasilar junction. Focal plaque within the left V4 segment with moderate stenosis. Minimal plaque within the right V4 segment without high-grade stenosis. Posterior inferior cerebral arteries patent. Basilar artery well opacified to its distal aspect. Superior cerebral arteries  patent bilaterally. Both of the posterior cerebral arteries arise from the basilar artery and are well opacified to their distal aspects. Venous sinuses: Patent without evidence for venous sinus thrombosis Anatomic variants: No anatomic variant. No aneurysm or vascular malformation. Delayed phase: No abnormal enhancement on delayed sequence. IMPRESSION: CTA NECK IMPRESSION: 1. 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 2. More mild atheromatous plaque about the left carotid bifurcation with associated narrowing of approximately 50% by NASCET criteria. 3. Stent in place within the proximal left ICA. Patent flow through the stent without significant intraluminal narrowing. 4. Focal plaque at the origin of the right vertebral artery with secondary mild to moderate narrowing. Vertebral arteries otherwise patent within the neck. CTA HEAD IMPRESSION: 1. No large or proximal arterial branch occlusion within the intracranial circulation. No high-grade or correctable stenosis. 2. Focal plaque within the left V4 segment with moderate focal stenosis. 3. Mild atheromatous plaque within the cavernous ICAs without flow-limiting stenosis. Electronically Signed   By: Rise MuBenjamin  McClintock M.D.   On: 04/28/2016 02:53    Micro Results     No results found for this or any previous visit (from the past 240 hour(s)).     Today   Subjective:   Tanner Rangel today has no headache,no chest abdominal pain,no new weakness tingling or numbness, feels much better wants to  go home today.  Objective:   Blood pressure 130/48, pulse 53, temperature 97.6 F (36.4 C), temperature source Oral, resp. rate 14, height 6\' 2"  (1.88 m), weight 83.207 kg (183 lb 7 oz), SpO2 100 %.  No intake or output data in the 24 hours ending 04/28/16 0834  Exam Awake Alert, Oriented x 3, No new F.N deficits, Normal affect [place].AT,PERRAL Supple Neck,No JVD, No cervical lymphadenopathy appriciated.  Symmetrical Chest wall movement, Good air movement bilaterally, CTAB RRR,No Gallops,Rubs or new Murmurs, No Parasternal Heave +ve B.Sounds, Abd Soft, Non tender, No organomegaly appriciated, No rebound -guarding or rigidity. No Cyanosis, Clubbing or edema, No new Rash or bruise  Data Review   CBC w Diff:  Lab Results  Component Value Date   WBC 9.5 04/27/2016   HGB 13.0 04/27/2016   HCT 37.9* 04/27/2016   PLT 261 04/27/2016   LYMPHOPCT 36 04/27/2016   MONOPCT 9 04/27/2016   EOSPCT 2 04/27/2016   BASOPCT 1 04/27/2016    CMP:  Lab Results  Component Value Date   NA 143 04/27/2016   K 3.7 04/27/2016   CL 109 04/27/2016   CO2 28 04/27/2016   BUN 11 04/27/2016   CREATININE 1.00 04/27/2016   PROT 7.3 04/27/2016   ALBUMIN 4.4 04/27/2016   BILITOT 0.3 04/27/2016   ALKPHOS 48 04/27/2016   AST 20 04/27/2016   ALT 10* 04/27/2016  .   Total Time in preparing paper work, data evaluation and todays exam - 35 minutes  Mattias Walmsley M.D on 04/28/2016 at 8:34 AM    Note: This dictation was prepared with Dragon dictation along with smaller phrase technology. Any transcriptional errors that result from this process are unintentional.

## 2016-04-28 NOTE — Care Management Note (Signed)
Case Management Note  Patient Details  Name: Tanner Rangel MRN: [id number] Date of Birth: 1961/04/08  Subjective/Objective:   Provided Mr Tanner Rangel with a cab voucher to the [employer], [address], [place], [place] per Mr Tanner [place] from [place] reports that address is where his truck is parked.               Action/Plan:   Expected Discharge Date:                  Expected Discharge Plan:     In-House Referral:     Discharge planning Services     Post Acute Care Choice:    Choice offered to:     DME Arranged:    DME Agency:     HH Arranged:    HH Agency:     Status of Service:     Medicare Important Message Given:    Date Medicare IM Given:    Medicare IM give by:    Date Additional Medicare IM Given:    Additional Medicare Important Message give by:     If discussed at Long Length of Stay Meetings, dates discussed:    Additional Comments:  Zeyad Delaguila A, RN 04/28/2016, 3:03 PM

## 2016-04-28 NOTE — Evaluation (Signed)
Speech screened for speech and swallowing on 04/28/2016. Spoke to nursing who states that pt passed swallow assessment and had no difficulties with morning meals or medications. SLP will sign off with education to RN to contact ST if changes in status occur. SLP screened no interventions necessary.

## 2016-04-28 NOTE — ED Provider Notes (Signed)
CT Angio Head W/Cm &/Or Wo Cm (Final result) Result time: 04/28/16 02:53:07   Final result by Rad Results In Interface (04/28/16 02:53:07)   Narrative:   CLINICAL DATA: Initial evaluation for acute headache with left arm numbness, photosensitivity.  EXAM: CT ANGIOGRAPHY HEAD AND NECK  TECHNIQUE: Multidetector CT imaging of the head and neck was performed using the standard protocol during bolus administration of intravenous contrast. Multiplanar CT image reconstructions and MIPs were obtained to evaluate the vascular anatomy. Carotid stenosis measurements (when applicable) are obtained utilizing NASCET criteria, using the distal internal carotid diameter as the denominator.  CONTRAST: 100 cc of Isovue 370.  COMPARISON: Prior CT from 04/27/2016.  FINDINGS: CTA NECK  Aortic arch: Visualized aortic arch of normal caliber with normal branch pattern. Scattered atheromatous plaque within the arch itself and about the origin of the great vessels without high-grade stenosis. Visualized subclavian arteries are widely patent.  Right carotid system: Right common carotid artery patent from its origin to the bifurcation. Scattered calcified atheromatous plaque about the right bifurcation/proximal right ICA. Associated short-segment stenosis of approximately 60% present by NASCET criteria. Right ICA well opacified distally to the skullbase without stenosis, dissection, or occlusion.  Left carotid system: Left common carotid artery patent from its origin to the bifurcation. Scattered calcified plaque about the left bifurcation with moderate narrowing of approximately 50%. Stent in place within the proximal left ICA extending from the bifurcation there is patent flow through the stent without significant intraluminal stenosis. Distally, flow within the left ICA patent to the skullbase without stenosis, dissection, or occlusion.  Vertebral arteries:Both vertebral arteries arise  from the subclavian arteries. Focal plaque at the origin of the right vertebral artery with mild to moderate narrowing. Right vertebral artery is dominant. Vertebral arteries otherwise patent within the neck without stenosis, dissection, or occlusion.  Skeleton: No acute osseous abnormality. No worrisome lytic or blastic osseous lesions. Mild to moderate degenerative spondylolysis at C5-6.  Other neck: Visualized lungs are clear. Visualized superior mediastinum within normal limits. Right-sided pacemaker partially visualized. Thyroid gland normal. No adenopathy within the neck. No acute soft tissue abnormality.  CTA HEAD  Anterior circulation: Petrous segments widely patent bilaterally. Cavernous and supraclinoid segments patent without flow-limiting stenosis. Minimal atheromatous plaque within the cavernous ICAs. A1 segments, anterior communicating artery, and anterior cerebral arteries well opacified.  M1 segments patent without stenosis or occlusion. MCA bifurcations within normal limits. No proximal M2 branch occlusion. Distal MCA branches well opacified and symmetric bilaterally.  Posterior circulation: Vertebral arteries patent to the vertebrobasilar junction. Focal plaque within the left V4 segment with moderate stenosis. Minimal plaque within the right V4 segment without high-grade stenosis. Posterior inferior cerebral arteries patent. Basilar artery well opacified to its distal aspect. Superior cerebral arteries patent bilaterally. Both of the posterior cerebral arteries arise from the basilar artery and are well opacified to their distal aspects.  Venous sinuses: Patent without evidence for venous sinus thrombosis  Anatomic variants: No anatomic variant. No aneurysm or vascular malformation.  Delayed phase: No abnormal enhancement on delayed sequence.  IMPRESSION: CTA NECK IMPRESSION:  1. 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 2. More mild atheromatous plaque about the left carotid bifurcation with associated narrowing of approximately 50% by NASCET criteria. 3. Stent in place within the proximal left ICA. Patent flow through the stent without significant intraluminal narrowing. 4. Focal plaque at the origin of the right vertebral artery with secondary mild  to moderate narrowing. Vertebral arteries otherwise patent within the neck. CTA HEAD IMPRESSION:  1. No large or proximal arterial branch occlusion within the intracranial circulation. No high-grade or correctable stenosis. 2. Focal plaque within the left V4 segment with moderate focal stenosis. 3. Mild atheromatous plaque within the cavernous ICAs without flow-limiting stenosis.   Electronically Signed By: Rise Mu M.D. On: 04/28/2016 02:53          CT Angio Neck W/Cm &/Or Wo/Cm (Final result) Result time: 04/28/16 02:53:07   Final result by Rad Results In Interface (04/28/16 02:53:07)   Narrative:   CLINICAL DATA: Initial evaluation for acute headache with left arm numbness, photosensitivity.  EXAM: CT ANGIOGRAPHY HEAD AND NECK  TECHNIQUE: Multidetector CT imaging of the head and neck was performed using the standard protocol during bolus administration of intravenous contrast. Multiplanar CT image reconstructions and MIPs were obtained to evaluate the vascular anatomy. Carotid stenosis measurements (when applicable) are obtained utilizing NASCET criteria, using the distal internal carotid diameter as the denominator.  CONTRAST: 100 cc of Isovue 370.  COMPARISON: Prior CT from 04/27/2016.  FINDINGS: CTA NECK  Aortic arch: Visualized aortic arch of normal caliber with normal branch pattern. Scattered atheromatous plaque within the arch itself and about the origin of the great vessels without high-grade stenosis. Visualized subclavian arteries are widely  patent.  Right carotid system: Right common carotid artery patent from its origin to the bifurcation. Scattered calcified atheromatous plaque about the right bifurcation/proximal right ICA. Associated short-segment stenosis of approximately 60% present by NASCET criteria. Right ICA well opacified distally to the skullbase without stenosis, dissection, or occlusion.  Left carotid system: Left common carotid artery patent from its origin to the bifurcation. Scattered calcified plaque about the left bifurcation with moderate narrowing of approximately 50%. Stent in place within the proximal left ICA extending from the bifurcation there is patent flow through the stent without significant intraluminal stenosis. Distally, flow within the left ICA patent to the skullbase without stenosis, dissection, or occlusion.  Vertebral arteries:Both vertebral arteries arise from the subclavian arteries. Focal plaque at the origin of the right vertebral artery with mild to moderate narrowing. Right vertebral artery is dominant. Vertebral arteries otherwise patent within the neck without stenosis, dissection, or occlusion.  Skeleton: No acute osseous abnormality. No worrisome lytic or blastic osseous lesions. Mild to moderate degenerative spondylolysis at C5-6.  Other neck: Visualized lungs are clear. Visualized superior mediastinum within normal limits. Right-sided pacemaker partially visualized. Thyroid gland normal. No adenopathy within the neck. No acute soft tissue abnormality.  CTA HEAD  Anterior circulation: Petrous segments widely patent bilaterally. Cavernous and supraclinoid segments patent without flow-limiting stenosis. Minimal atheromatous plaque within the cavernous ICAs. A1 segments, anterior communicating artery, and anterior cerebral arteries well opacified.  M1 segments patent without stenosis or occlusion. MCA bifurcations within normal limits. No proximal M2 branch  occlusion. Distal MCA branches well opacified and symmetric bilaterally.  Posterior circulation: Vertebral arteries patent to the vertebrobasilar junction. Focal plaque within the left V4 segment with moderate stenosis. Minimal plaque within the right V4 segment without high-grade stenosis. Posterior inferior cerebral arteries patent. Basilar artery well opacified to its distal aspect. Superior cerebral arteries patent bilaterally. Both of the posterior cerebral arteries arise from the basilar artery and are well opacified to their distal aspects.  Venous sinuses: Patent without evidence for venous sinus thrombosis  Anatomic variants: No anatomic variant. No aneurysm or vascular malformation.  Delayed phase: No abnormal enhancement on delayed sequence.  IMPRESSION: CTA  NECK IMPRESSION:  1. 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 2. More mild atheromatous plaque about the left carotid bifurcation with associated narrowing of approximately 50% by NASCET criteria. 3. Stent in place within the proximal left ICA. Patent flow through the stent without significant intraluminal narrowing. 4. Focal plaque at the origin of the right vertebral artery with secondary mild to moderate narrowing. Vertebral arteries otherwise patent within the neck. CTA HEAD IMPRESSION:  1. No large or proximal arterial branch occlusion within the intracranial circulation. No high-grade or correctable stenosis. 2. Focal plaque within the left V4 segment with moderate focal stenosis. 3. Mild atheromatous plaque within the cavernous ICAs without flow-limiting stenosis.   Electronically Signed By: Rise MuBenjamin McClintock M.D. On: 04/28/2016 02:53          CT Head Wo Contrast (Final result) Result time: 04/27/16 21:42:43   Final result by Rad Results In Interface (04/27/16 21:42:43)   Narrative:   CLINICAL  DATA: Code stroke. Acute onset of headache and insomnia. Initial encounter.  EXAM: CT HEAD WITHOUT CONTRAST  TECHNIQUE: Contiguous axial images were obtained from the base of the skull through the vertex without intravenous contrast.  COMPARISON: None.  FINDINGS: There is no evidence of acute infarction, mass lesion, or intra- or extra-axial hemorrhage on CT.  The posterior fossa, including the cerebellum, brainstem and fourth ventricle, is within normal limits. The third and lateral ventricles, and basal ganglia are unremarkable in appearance. The cerebral hemispheres are symmetric in appearance, with normal gray-white differentiation. No mass effect or midline shift is seen.  There is no evidence of acute fracture; bore holes are noted at the right frontal and parietal calvarium. An apparent chronic fracture is noted at the left zygomatic arch. The orbits are within normal limits. The paranasal sinuses and mastoid air cells are well-aerated. No significant soft tissue abnormalities are seen.  IMPRESSION: Unremarkable noncontrast CT of the head.  These results were called by telephone at the time of interpretation on 04/27/2016 at 9:42 pm to Dr. Toney RakesERYKA GAYLE, who verbally acknowledged these results.   Electronically Signed By: Roanna RaiderJeffery Chang M.D. On: 04/27/2016 21:42     CTs reviewed. Multiple areas of plaque noted. Patient felt to be elevated risk for possible embolic disease and stroke. We will admit the patient to the hospitalist service for further workup per recommendations by tele-neurology.  Sharyn CreamerMark Jermichael Belmares, MD 04/28/16 [phone]

## 2016-07-03 IMAGING — CT CT ANGIO NECK
2 of 8 series · 8 of 33 positions shown · IV contrast (APPLIED)
Comparison: Prior CT from 04/27/2016.

CLINICAL DATA: Initial evaluation for acute headache with left arm
numbness, photosensitivity.

EXAM:
CT ANGIOGRAPHY HEAD AND NECK
TECHNIQUE: Multidetector CT imaging of the head and neck was performed using
the standard protocol during bolus administration of intravenous
contrast. Multiplanar CT image reconstructions and MIPs were
obtained to evaluate the vascular anatomy. Carotid stenosis
measurements (when applicable) are obtained utilizing NASCET
criteria, using the distal internal carotid diameter as the
denominator.
CONTRAST:  100 cc of Isovue 370.

[Series 5: cta head · axial · 0.46mm/px · z∈[-316,-62]mm · 4 of 418 slices shown]
[im 84/418  soft-tissue]
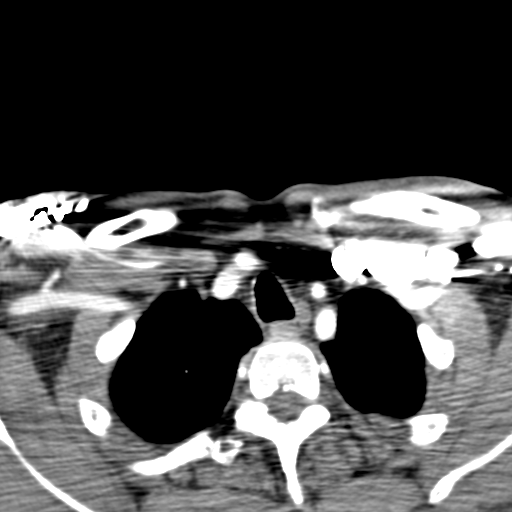
[im 167/418  bone]
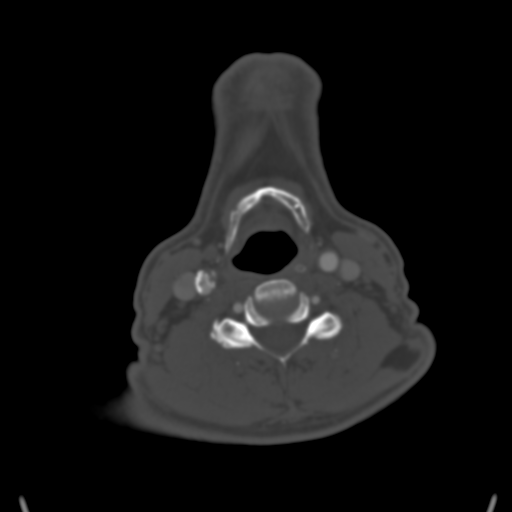
[im 251/418  soft-tissue]
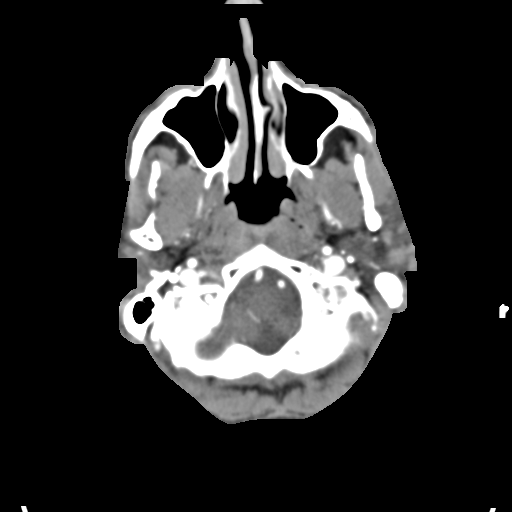
[im 334/418  bone]
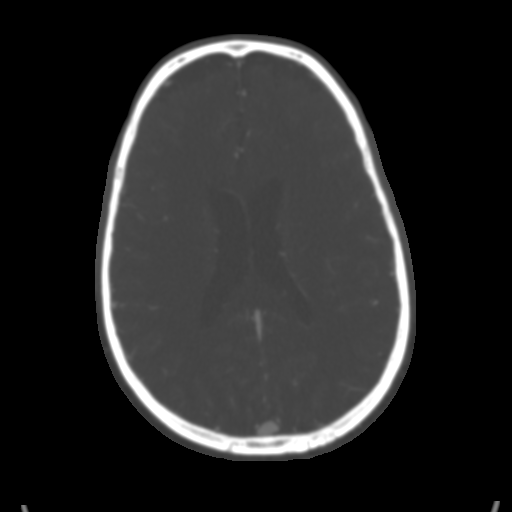

[Series 6: ax thin · axial · 0.42mm/px · z∈[-341,-99]mm · 4 of 411 slices shown]
[im 83/411  soft-tissue]
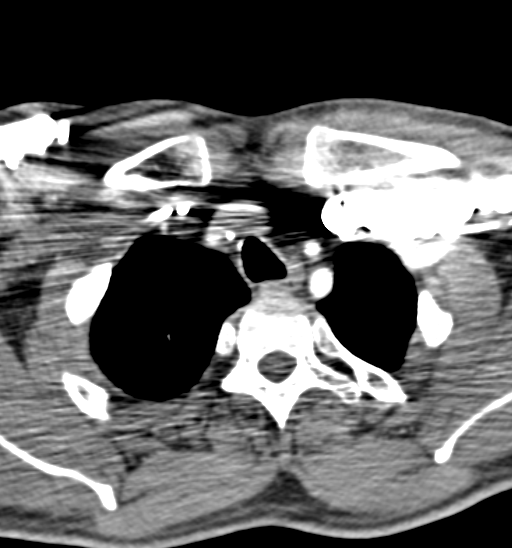
[im 165/411  soft-tissue]
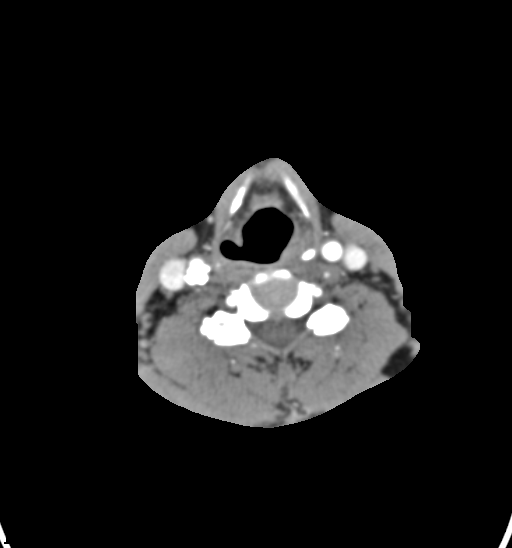
[im 247/411  soft-tissue]
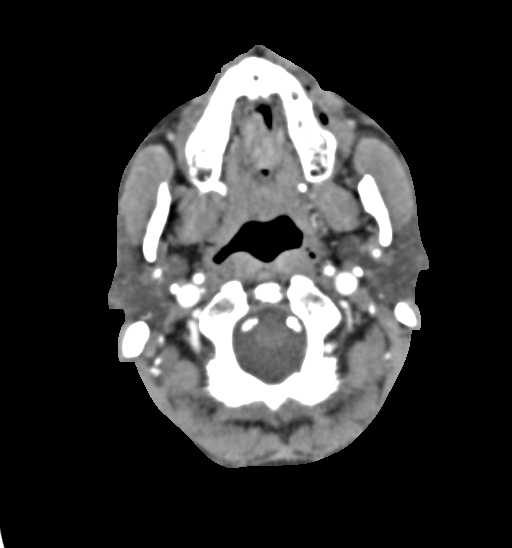
[im 329/411  soft-tissue]
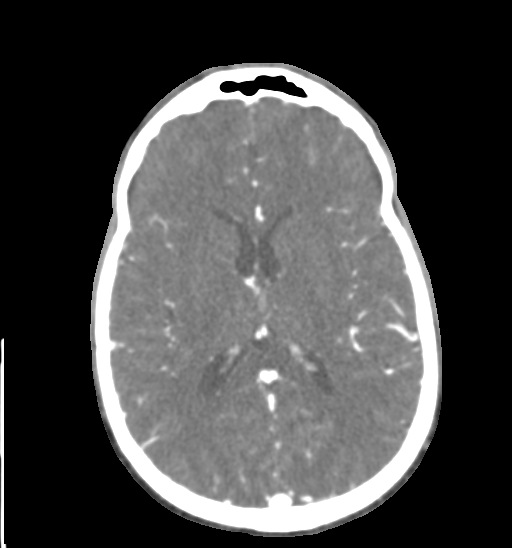

[8 of 33 positions shown; findings below may reference images not displayed]

FINDINGS: CTA NECK

Aortic arch: Visualized aortic arch of normal caliber with normal
branch pattern. Scattered atheromatous plaque within the arch itself
and about the origin of the great vessels without high-grade
stenosis. Visualized subclavian arteries are widely patent.

Right carotid system: Right common carotid artery patent from its
origin to the bifurcation. Scattered calcified atheromatous plaque
about the right bifurcation/proximal right ICA. Associated
short-segment stenosis of approximately 60% present by NASCET
criteria. Right ICA well opacified distally to the skullbase without
stenosis, dissection, or occlusion.

Left carotid system: Left common carotid artery patent from its
origin to the bifurcation. Scattered calcified plaque about the left
bifurcation with moderate narrowing of approximately 50%. Stent in
place within the proximal left ICA extending from the bifurcation
there is patent flow through the stent without significant
intraluminal stenosis. Distally, flow within the left ICA patent to
the skullbase without stenosis, dissection, or occlusion.

Vertebral arteries:Both vertebral arteries arise from the subclavian
arteries. Focal plaque at the origin of the right vertebral artery
with mild to moderate narrowing. Right vertebral artery is dominant.
Vertebral arteries otherwise patent within the neck without
stenosis, dissection, or occlusion.

Skeleton: No acute osseous abnormality. No worrisome lytic or
blastic osseous lesions. Mild to moderate degenerative spondylolysis
at C5-6.

Other neck: Visualized lungs are clear. Visualized superior
mediastinum within normal limits. Right-sided pacemaker partially
visualized. Thyroid gland normal. No adenopathy within the neck. No
acute soft tissue abnormality.

CTA HEAD

Anterior circulation: Petrous segments widely patent bilaterally.
Cavernous and supraclinoid segments patent without flow-limiting
stenosis. Minimal atheromatous plaque within the cavernous ICAs. A1
segments, anterior communicating artery, and anterior cerebral
arteries well opacified.

M1 segments patent without stenosis or occlusion. MCA bifurcations
within normal limits. No proximal M2 branch occlusion. Distal MCA
branches well opacified and symmetric bilaterally.

Posterior circulation: Vertebral arteries patent to the
vertebrobasilar junction. Focal plaque within the left V4 segment
with moderate stenosis. Minimal plaque within the right V4 segment
without high-grade stenosis. Posterior inferior cerebral arteries
patent. Basilar artery well opacified to its distal aspect. Superior
cerebral arteries patent bilaterally. Both of the posterior cerebral
arteries arise from the basilar artery and are well opacified to
their distal aspects.

Venous sinuses: Patent without evidence for venous sinus thrombosis

Anatomic variants: No anatomic variant. No aneurysm or vascular
malformation.

Delayed phase: No abnormal enhancement on delayed sequence.
IMPRESSION: CTA NECK IMPRESSION:

1. Atheromatous plaque about the right carotid bifurcation with
associated short-segment stenosis of up to 60% by NASCET criteria.
2. More mild atheromatous plaque about the left carotid bifurcation
with associated narrowing of approximately 50% by NASCET criteria.
3. Stent in place within the proximal left ICA. Patent flow through
the stent without significant intraluminal narrowing.
4. Focal plaque at the origin of the right vertebral artery with
secondary mild to moderate narrowing. Vertebral arteries otherwise
patent within the neck.
CTA HEAD IMPRESSION:

1. No large or proximal arterial branch occlusion within the
intracranial circulation. No high-grade or correctable stenosis.
2. Focal plaque within the left V4 segment with moderate focal
stenosis.
3. Mild atheromatous plaque within the cavernous ICAs without
flow-limiting stenosis.
